# Patient Record
Sex: Male | Born: 1955 | Race: White | Hispanic: No | Marital: Married
Health system: Southern US, Community
[De-identification: ages and names within clinical notes are randomized; demographics above are authoritative.]

---

## 2013-04-19 ENCOUNTER — Observation Stay: Payer: Self-pay | Admitting: Specialist

## 2013-04-19 LAB — CBC
HCT: 43.8 %
HGB: 15.4 g/dL
MCH: 29.8 pg
MCHC: 35.1 g/dL
MCV: 85 fL
Platelet: 185 x10 3/mm 3
RBC: 5.15 x10 6/mm 3
RDW: 13.2 %
WBC: 7.4 x10 3/mm 3

## 2013-04-19 LAB — CK TOTAL AND CKMB (NOT AT ARMC)
CK, Total: 36 U/L
CK-MB: 0.6 ng/mL

## 2013-04-19 LAB — BASIC METABOLIC PANEL
Anion Gap: 6 — ABNORMAL LOW (ref 7–16)
BUN: 13 mg/dL (ref 7–18)
Calcium, Total: 8.9 mg/dL (ref 8.5–10.1)
Co2: 25 mmol/L (ref 21–32)
Potassium: 4 mmol/L (ref 3.5–5.1)
Sodium: 137 mmol/L (ref 136–145)

## 2013-04-19 LAB — TROPONIN I
Troponin-I: 0.02 ng/mL
Troponin-I: 0.03 ng/mL
Troponin-I: 0.05 ng/mL

## 2013-04-19 LAB — PRO B NATRIURETIC PEPTIDE: B-Type Natriuretic Peptide: 74 pg/mL

## 2014-08-16 NOTE — Discharge Summary (Signed)
PATIENT NAME:  Jimmy [name], Maddock MR#:  [id number] DATE OF BIRTH:  [birth year]  DATE OF ADMISSION:  04/19/2013 DATE OF DISCHARGE:  04/20/2013  DISCHARGE DIAGNOSIS:  Chest pain, likely stress or gastrointestinal related, now resolved.  Negative cardiac enzymes and negative Myoview per verbal report by cardiology, Dr. Juliann Paresallwood.   SECONDARY DIAGNOSES: 1.  Hypertension.  2.  Neuropathy.  3.  Hyperlipidemia.  4.  Coronary artery disease.   CONSULTATION: Cardiology, Dr. Juliann Paresallwood.   PROCEDURES AND RADIOLOGY: Chest x-ray on the 25th of December showed no acute cardiopulmonary disease, advanced COPD.    HISTORY AND SHORT HOSPITAL COURSE: The patient is a 59 year old male with the above-mentioned medical problems who was admitted for chest pain. Please see Dr. Hilbert OdorSainani's dictated history and physical for further details. Cardiology consultation was obtained with Dr. Juliann Paresallwood who recommended Myoview which was obtained with negative results per his verbal report. The patient was ruled out with 3 negative sets of cardiac enzymes. He remained chest pain-free and was discharged home in stable condition.   PERTINENT PHYSICAL EXAMINATION ON THE DATE OF DISCHARGE: VITAL SIGNS: Temperature 97.8, heart rate 67 per minute, respirations 18 per minute, blood pressure 114/17 mmHg.  He was saturating 97% on room air.  CARDIOVASCULAR: S1, S2 normal. No murmurs, rubs or gallop.  LUNGS: Clear to auscultation bilaterally. No wheezing, rales, rhonchi or crepitation.  ABDOMEN: Soft, benign.  NEUROLOGIC: Nonfocal examination.  All other physical examination remained at baseline.   DISCHARGE MEDICATIONS: 1.  Simvastatin 80 mg p.o. at bedtime.  2.  Pregabalin 150 mg 1 capsule twice a day.  3.  Lisinopril 2.5 mg p.o. daily.  4.  Aspirin 81 mg p.o. daily.  5.  Metoprolol 12.5 mg p.o. b.i.d.   DISCHARGE DIET: Low sodium, low fat, low cholesterol.   DISCHARGE ACTIVITY: As tolerated.   DISCHARGE INSTRUCTIONS AND FOLLOWUP:  The patient was instructed to follow up with his primary care physician in [place] in 1 to 2 weeks. He will need followup with his cardiologist in his home town in [place] in 2 to 4 weeks.   TOTAL TIME DISCHARGING THIS PATIENT: 45 minutes.   ____________________________ Ellamae SiaVipul S. Sherryll BurgerShah, MD vss:cs D: 04/22/2013 10:33:00 ET T: 04/22/2013 19:40:51 ET JOB#: [id number]  cc: [facility]CP [facility] [place] Jemel Ono S. Sherryll BurgerShah, MD, <Dictator>  Ellamae SiaVIPUL S [facility] MD ELECTRONICALLY SIGNED 04/23/2013 6:50

## 2014-08-17 NOTE — Consult Note (Signed)
PATIENT NAME:  Jimmy Wagner, Mcclellan MR#:  [id number] DATE OF BIRTH:  Sep 10, 1955  CARDIOLOGY CONSULTATION   DATE OF CONSULTATION:  04/20/2013  REFERRING PHYSICIAN:  Vivek J. Cherlynn KaiserSainani, MD CONSULTING PHYSICIAN:  Laverle Pillard Wagner. Charizma Gardiner, MD  INDICATION: Chest pain, coronary artery disease.   HISTORY OF PRESENT ILLNESS: The patient is a 59 year old white male with known coronary artery disease. Chest pain began 2 or 3 days ago, described as burning feeling located in the epigastric area. He reportedly had a myocardial infarction in 2006. Described burning feeling in epigastric center with this admission and some reflux symptoms. Some pain in his shoulder and also in his neck. Denies dyspnea, shortness of breath. No diaphoresis or dizziness. He became concerned, so he came to the Emergency Room. On initial evaluation by the hospitalist, he was pain-free. When I saw him, he was also pain-free, but was admitted for further evaluation.   REVIEW OF SYSTEMS: No blackout spells or syncope. No nausea or vomiting. No fever, no chills, no sweats, no weight loss, no weight gain. No hemoptysis, hematemesis or bright red blood per rectum. No vision changes, no hearing changes. Denies sputum production or cough.   PAST MEDICAL HISTORY: 1. Hypertension.  2. Neuropathy.  3. Hyperlipidemia.  4. Coronary artery disease. 5. Smoking.   ALLERGIES: None.   SOCIAL HISTORY: Smokes half-pack a day. Denies drug use. Lives with his wife.   FAMILY HISTORY: Complaint of history of neuropathy in his family.   MEDICATIONS:  1. Aspirin 81 mg a day.  2. Lisinopril 2.5 daily.  3. Metoprolol 12.5 twice a day. 4. Lyrica 150 mg twice a day.  5. Simvastatin 80 mg at bedtime.   PAST SURGICAL HISTORY: Angioplasty and stent during his acute myocardial infarction.   PHYSICAL EXAMINATION:  VITAL SIGNS: Blood pressure 130/70, pulse 70, respiratory rate of 16, afebrile.  HEENT: Normocephalic, atraumatic. Pupils equally reactive to light.   NECK: Supple. No significant JVD, bruits or adenopathy.  LUNGS: Clear to auscultation and percussion. No significant wheeze, rhonchi or rales.  HEART: Regular rate and rhythm. Positive bowel sounds. No rebound, guarding or tenderness. EXTREMITIES: Within normal limits.   NEUROLOGIC: Intact.  SKIN: Normal.   LABORATORIES: Glucose 133, BUN of 13, creatinine 0.7, sodium 137, potassium 4, chloride of 106, bicarbonate of 25. Troponin 0.02. White count 7.4, hemoglobin 15.4, hematocrit 43.8, platelet count 185.   ELECTROCARDIOGRAM: Sinus bradycardia, nonspecific findings.   ASSESSMENT:  1. Possible angina.  2. Chest pain.  3. Hypertension.  4. Hyperlipidemia.  5. Smoking.  6. Neuropathy.  7. Coronary artery disease.   PLAN:  1. Agree with admit. Rule out for myocardial infarction. Follow up cardiac enzymes. Follow up EKG. Continue telemetry. Proceed with probably a functional study, probably a Myoview to rule out ischemia. 2. Continue hypertension control. 3. Continue hyperlipidemia therapy and care.  4. Continue aspirin therapy. 5. Advised the patient to quit smoking.  6. Based on functional study, will determine whether further evaluation is necessary.   ____________________________ Jimmy Wagner Wagner. Juliann Paresallwood, MD ddc:lb Wagner: 04/20/2013 10:58:33 ET T: 04/20/2013 11:36:45 ET JOB#: [id number]  cc: Jimmy Wagner Wagner. Juliann Paresallwood, MD, <Dictator> Jimmy Wagner Jimmy Montrose MD ELECTRONICALLY SIGNED 05/21/2013 16:32

## 2014-08-17 NOTE — H&P (Signed)
PATIENT NAME:  Jimmy Wagner, Jimmy Wagner MR#:  [id number] DATE OF BIRTH:  [birth year]  DATE OF ADMISSION:  04/19/2013  [facility] [place].  CHIEF COMPLAINT: Chest pain.   HISTORY OF PRESENT ILLNESS: This is a 59 year old male who comes into the hospital complaining of chest pain that began 2 or 3 days ago. He describes the pain as a burning feeling located in the epigastric/center of his chest area. He thought it was initially related to some reflux although this morning when the patient developed the pain and burning feeling, he also had some pain in his shoulders and also in his neck. He did not become short of breath. He had no diaphoresis, no dizziness, no nausea, no vomiting. No other associated symptoms. He does have a previous history of MI in 2006 and his symptoms are very similar at that time. He was, therefore, a bit concerned and therefore came to the ER for further evaluation. The patient is presently chest pain-free and hemodynamically stable. Hospitalist services were contacted for further treatment and evaluation.   REVIEW OF SYSTEMS: CONSTITUTIONAL: No documented fever. No weight gain. No weight loss.  EYES: No blurry or double vision.  ENT: No tinnitus. No postnasal drip. No redness of the oropharynx.  RESPIRATORY: No cough. No wheeze. No hemoptysis. No dyspnea.  CARDIOVASCULAR: Positive chest pain. No orthopnea. No palpitations. No syncope.  GASTROINTESTINAL: No nausea, no vomiting, diarrhea. No abdominal pain. No melena or hematochezia.  GENITOURINARY: No dysuria or hematuria.  ENDOCRINE: No polyuria or nocturia. No heat or cold intolerance. HEMATOLOGIC: No anemia. No bruising. No bleeding.  INTEGUMENTARY: No rashes or lesions.  MUSCULOSKELETAL: No arthritis. No swelling. No gout.  NEUROLOGIC: No numbness or tingling. No ataxia. No seizure activity.  PSYCHIATRIC: No anxiety. No insomnia. No ADD.   PAST MEDICAL HISTORY: Consistent with hypertension,  neuropathy, hyperlipidemia, history of coronary artery disease, ongoing tobacco abuse.   ALLERGIES: No known drug allergies.   SOCIAL HISTORY: Does smoke about 1/2 pack per day, has been smoking for the past 40+ years. Occasional alcohol use. No illicit drug abuse. Lives at home with his wife.   FAMILY HISTORY: He does not know much about his father's side.  His mother is alive and healthy. She has just neuropathy.   CURRENT MEDICATIONS: Aspirin 81 mg daily, lisinopril 2.5 mg daily, metoprolol tartrate 12.5 mg b.i.d., Lyrica 150 mg b.i.d., simvastatin 80 mg at bedtime.   PHYSICAL EXAMINATION: VITAL SIGNS:  Temperature 97.6, pulse 65, respirations 16, blood pressure 130/68, sats 97% on room air.  GENERAL: He is a pleasant-appearing male in no apparent distress.  HEAD, EYES, EARS, NOSE AND THROAT:  Atraumatic, normocephalic. Extraocular muscles are intact. Pupils equal and react to light. Sclerae anicteric. No conjunctival injection. No oropharyngeal erythema.  NECK: Supple. There is no jugular venous distention. No bruits. No lymphadenopathy. No thyromegaly.  HEART: Regular rate and rhythm. No murmurs. No rubs. No clicks.  LUNGS: Clear to auscultation bilaterally. No rales. No rhonchi. No wheezes.  ABDOMEN: Soft, flat, nontender, nondistended. Has good bowel sounds. No hepatosplenomegaly appreciated.  EXTREMITIES: No evidence of any cyanosis, clubbing or peripheral edema. Has +2 pedal and radial pulses bilaterally.  NEUROLOGICAL: The patient is alert, awake and oriented x 3 with no focal motor or sensory deficits appreciated bilaterally.  SKIN: Moist and warm with no rashes appreciated.  LYMPHATIC: There is no cervical or axillary lymphadenopathy.   LABORATORY DATA: Showed a serum glucose of 133, BUN 13, creatinine 0.7, sodium  137, potassium 4, chloride 106, bicarbonate 25. Troponin 0.02. White cell count 7.4, hemoglobin 15.4, hematocrit 43.8, platelet count of 185. EKG showed sinus  bradycardia, but no acute ST or T wave changes.   ASSESSMENT AND PLAN: This is a 59 year old male history of hypertension, hyperlipidemia, history of previous coronary artery disease, ongoing tobacco abuse, neuropathy, who presents to the hospital due to chest pain.   PROBLEM: 1.  Chest pain.  Questionable if the chest pain is cardiac versus gastrointestinal related. As the patient does have some burning type of epigastric pain although he does have risk factors given his history of coronary artery disease and ongoing tobacco abuse. I will observe him overnight on telemetry. He has already had 3 sets of cardiac markers, which are negative. I will get a Myoview in the morning. Continue aspirin. Continue beta blocker. Continue statin, nitroglycerin, oxygen and morphine. I will also give him some p.r.n. Maalox and Protonix for possible underlying reflux.  2.  Hypertension. Presently hemodynamically stable. Continue metoprolol and lisinopril.  3.  Hyperlipidemia. Continue simvastatin. 4.  Neuropathy: Continue Lyrica.   CODE STATUS: The patient is a FULL CODE.  TIME SPENT ON ADMISSION:  45 minutes    ____________________________ Rolly Pancake. Cherlynn Kaiser, MD vjs:ce D: 04/19/2013 16:58:22 ET T: 04/19/2013 18:53:21 ET JOB#: [id number]  cc: Rolly Pancake. Cherlynn Kaiser, MD, <Dictator> [place] Siren MD ELECTRONICALLY SIGNED 05/23/2013 11:13

## 2014-09-21 IMAGING — CR DG CHEST 2V
1 series · 4 of 4 positions shown · non-contrast
Comparison: None.

CLINICAL DATA: Chest pain.

EXAM:
CHEST  2 VIEW

[Series 1: pa · 0.17mm/px · 4 of 4 slices shown]
[im 1/4]
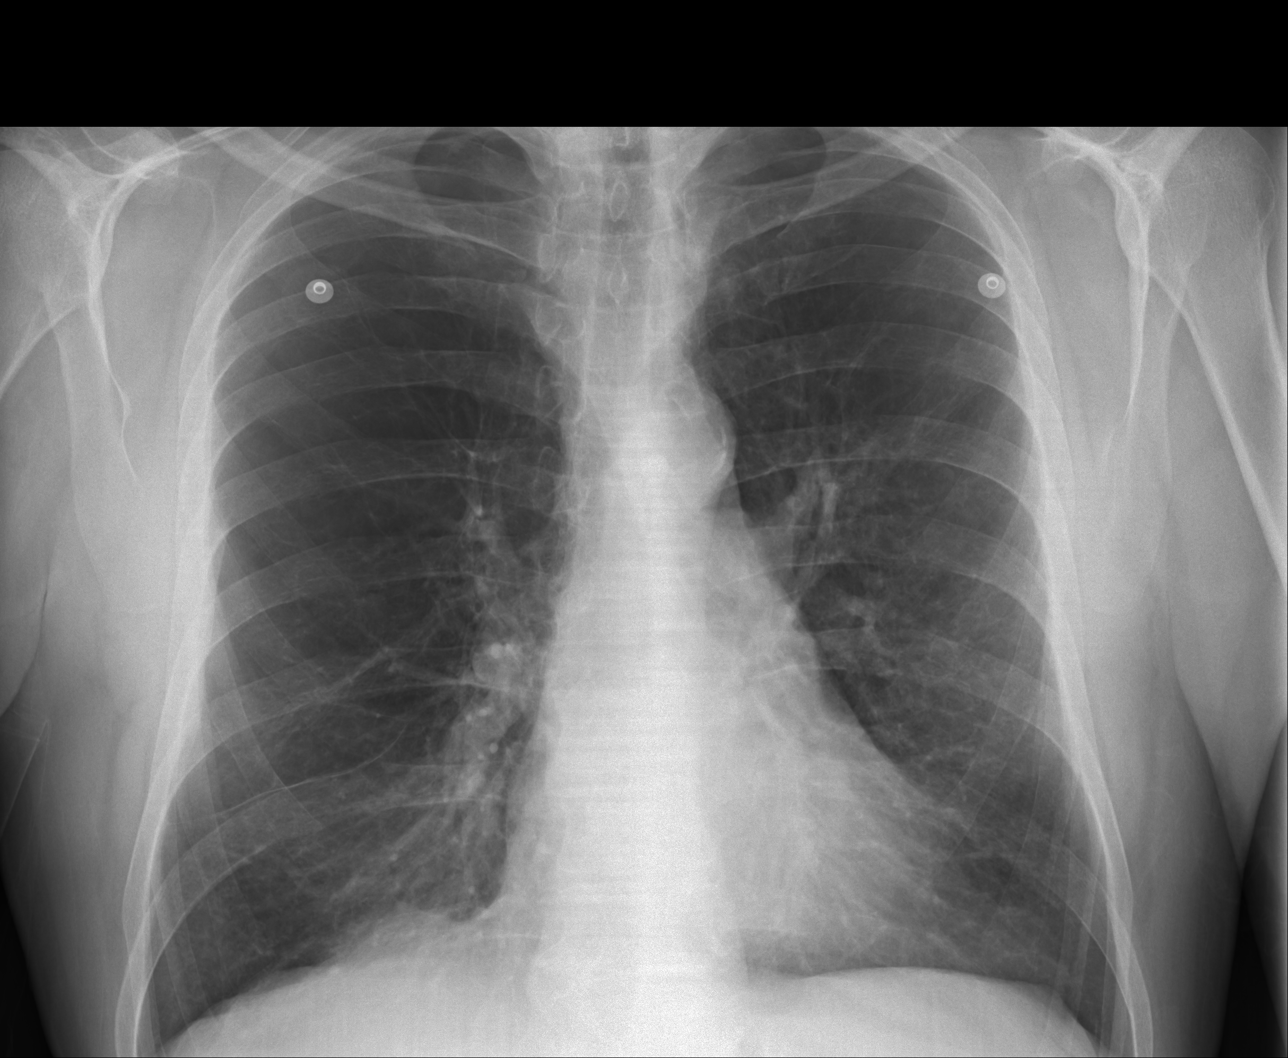
[im 2/4]
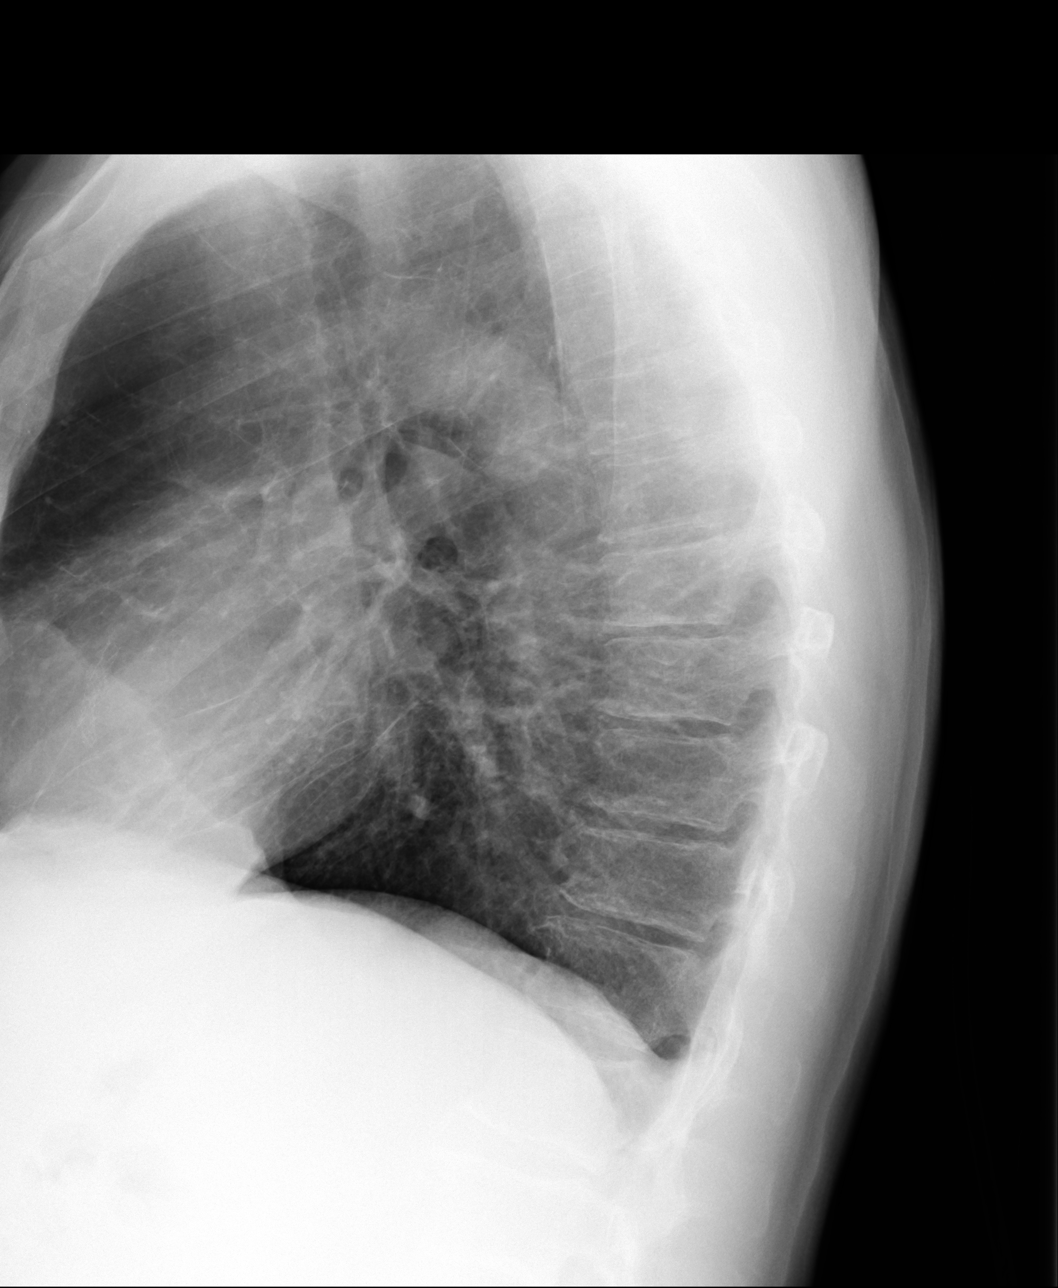
[im 3/4]
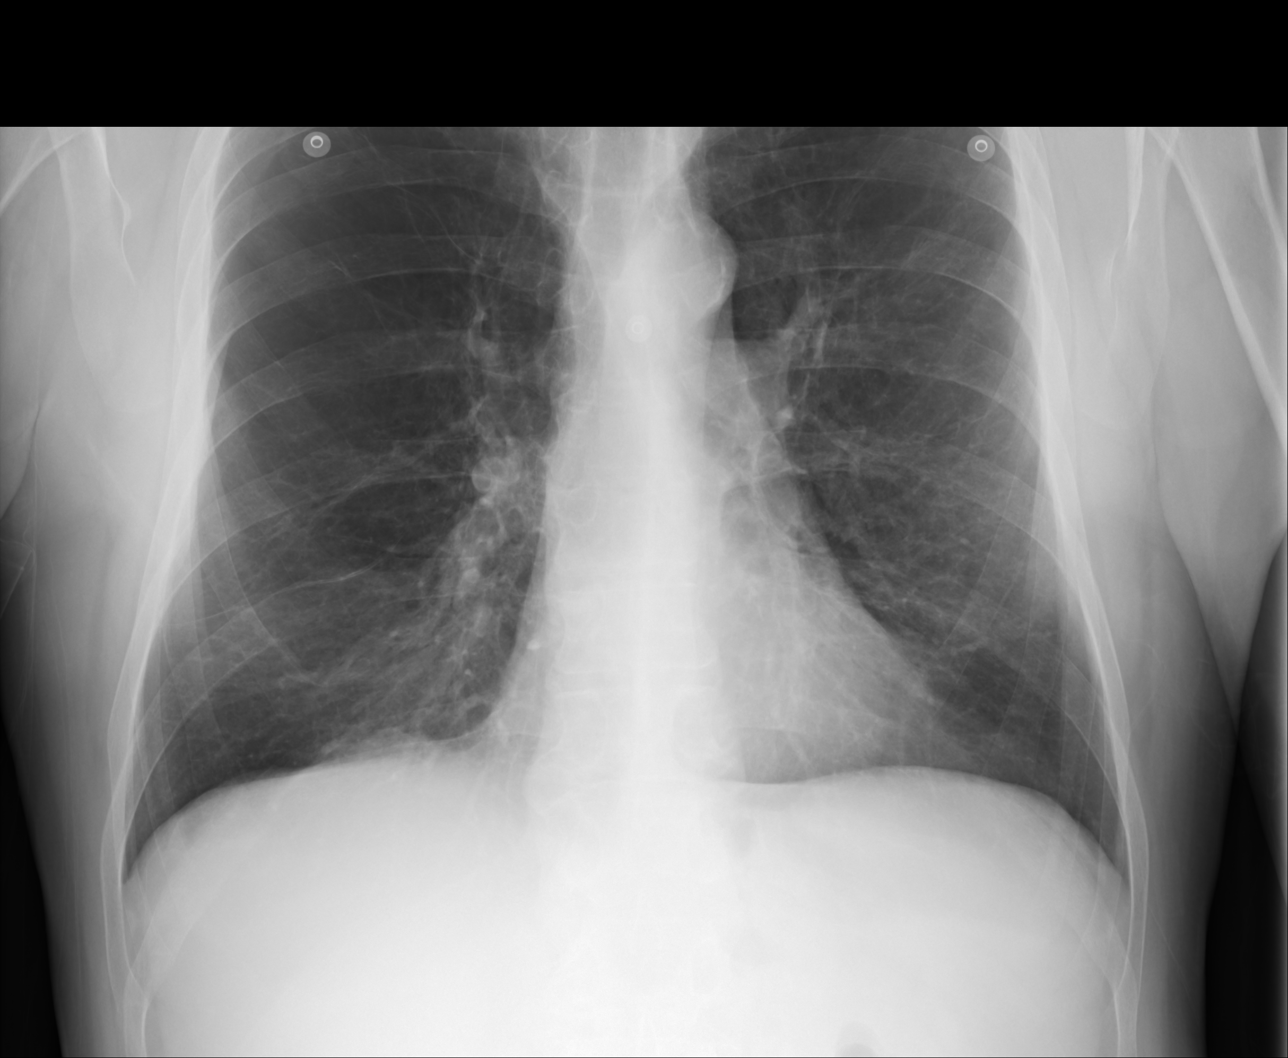
[im 4/4]
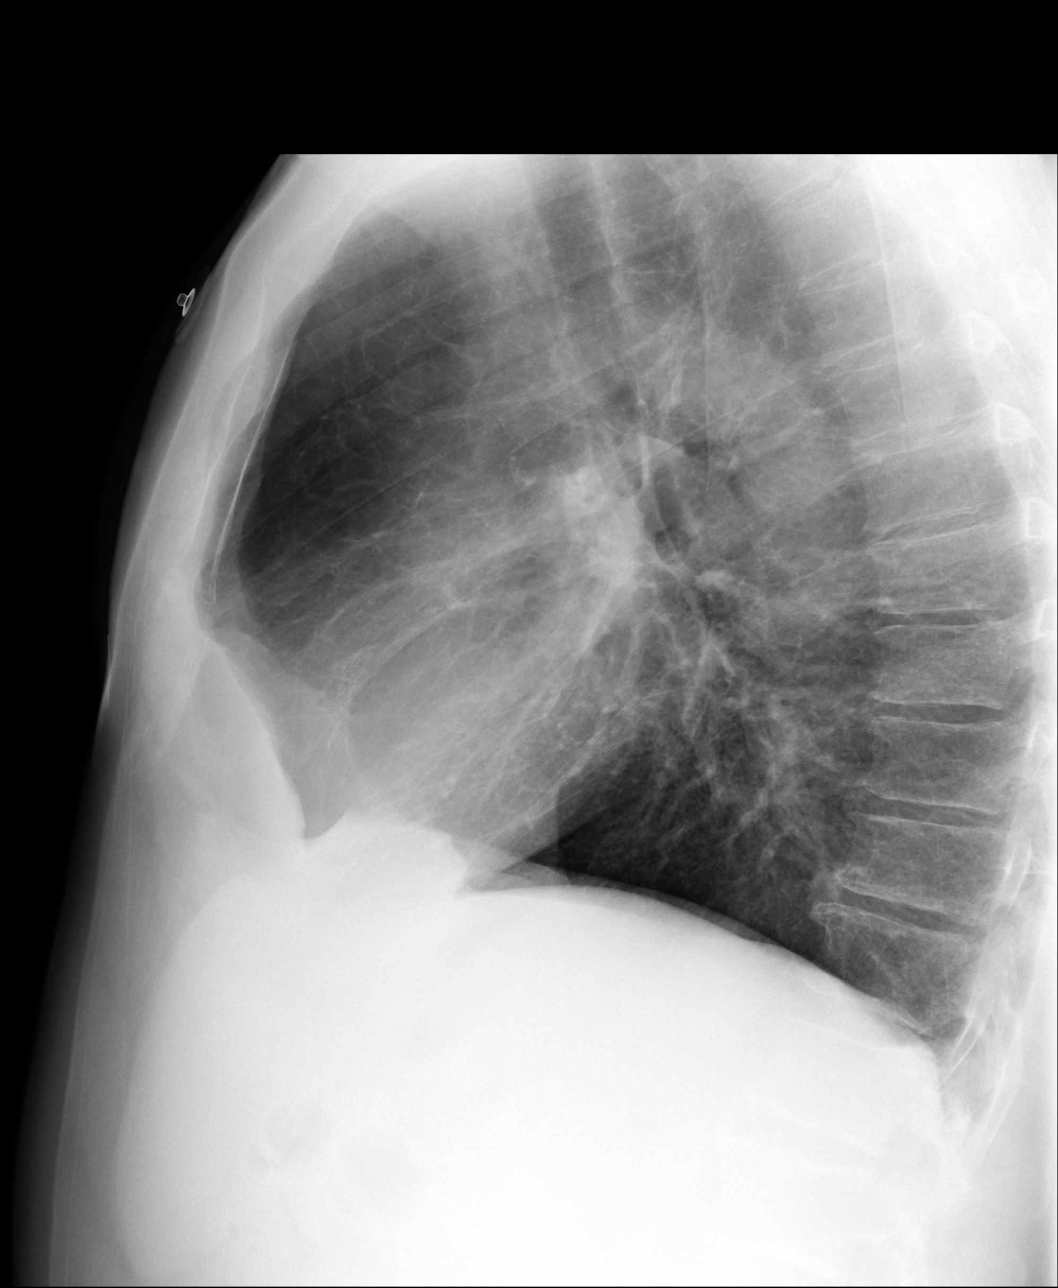

[4 of 4 positions shown; findings below may reference images not displayed]

FINDINGS: Lungs are hyperexpanded, with lucency in the upper lobes consistent
with emphysema. There is no consolidation or edema. No pleural
effusion or pneumothorax is seen.

Cardiac silhouette is normal in size. The mediastinum is normal in
contour.

The bony thorax is demineralized but intact.
IMPRESSION: No acute cardiopulmonary disease.

Advanced COPD.
